# Patient Record
Sex: Male | Born: 2000 | Hispanic: Yes | Marital: Single | Smoking: Never smoker
Health system: Southern US, Community
[De-identification: ages and names within clinical notes are randomized; demographics above are authoritative.]

---
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
## 2021-01-01 LAB — CBC
HCT: 25.6 % — ABNORMAL LOW (ref 39.0–52.0)
Hemoglobin: 8.7 g/dL — ABNORMAL LOW (ref 13.0–17.0)
MCH: 29.1 pg (ref 26.0–34.0)
MCHC: 34 g/dL (ref 30.0–36.0)
MCV: 85.6 fL (ref 80.0–100.0)
Platelets: 360 10*3/uL (ref 150–400)
RBC: 2.99 MIL/uL — ABNORMAL LOW (ref 4.22–5.81)
RDW: 13.4 % (ref 11.5–15.5)
WBC: 10.4 10*3/uL (ref 4.0–10.5)
nRBC: 0 % (ref 0.0–0.2)

## 2021-01-01 LAB — PHOSPHORUS: Phosphorus: 4.3 mg/dL (ref 2.5–4.6)

## 2021-01-01 LAB — DIFFERENTIAL
Abs Immature Granulocytes: 0.3 10*3/uL — ABNORMAL HIGH (ref 0.00–0.07)
Basophils Absolute: 0 10*3/uL (ref 0.0–0.1)
Basophils Relative: 0 %
Eosinophils Absolute: 0.3 10*3/uL (ref 0.0–0.5)
Eosinophils Relative: 2 %
Immature Granulocytes: 3 %
Lymphocytes Relative: 15 %
Lymphs Abs: 1.6 10*3/uL (ref 0.7–4.0)
Monocytes Absolute: 0.9 10*3/uL (ref 0.1–1.0)
Monocytes Relative: 8 %
Neutro Abs: 7.4 10*3/uL (ref 1.7–7.7)
Neutrophils Relative %: 72 %

## 2021-01-01 LAB — COMPREHENSIVE METABOLIC PANEL
ALT: 42 U/L (ref 0–44)
AST: 29 U/L (ref 15–41)
Albumin: 2.5 g/dL — ABNORMAL LOW (ref 3.5–5.0)
Alkaline Phosphatase: 105 U/L (ref 38–126)
Anion gap: 9 (ref 5–15)
BUN: 12 mg/dL (ref 6–20)
CO2: 26 mmol/L (ref 22–32)
Calcium: 8.9 mg/dL (ref 8.9–10.3)
Chloride: 101 mmol/L (ref 98–111)
Creatinine, Ser: 0.75 mg/dL (ref 0.61–1.24)
GFR, Estimated: >60 mL/min (ref >60)
Glucose, Bld: 103 mg/dL — ABNORMAL HIGH (ref 70–99)
Potassium: 4.8 mmol/L (ref 3.5–5.1)
Sodium: 136 mmol/L (ref 135–145)
Total Bilirubin: 1.3 mg/dL — ABNORMAL HIGH (ref 0.3–1.2)
Total Protein: 6.1 g/dL — ABNORMAL LOW (ref 6.5–8.1)

## 2021-01-01 LAB — GLUCOSE, CAPILLARY
Glucose-Capillary: 90 mg/dL (ref 70–99)
Glucose-Capillary: 81 mg/dL (ref 70–99)

## 2021-01-01 LAB — TRIGLYCERIDES: Triglycerides: 122 mg/dL (ref <150)

## 2021-01-01 LAB — MAGNESIUM: Magnesium: 2.1 mg/dL (ref 1.7–2.4)

## 2021-01-01 LAB — PREALBUMIN: Prealbumin: 14.6 mg/dL — ABNORMAL LOW (ref 18–38)

## 2021-01-01 MED ORDER — SODIUM CHLORIDE 0.9% FLUSH
10.0000 mL | INTRAVENOUS | Status: DC | PRN
  Administered 2021-01-03: 10 mL

## 2021-01-01 MED ORDER — ACETAMINOPHEN 160 MG/5ML PO SOLN
1000.0000 mg | Freq: Four times a day (QID) | ORAL | Status: DC
  Administered 2021-01-01 – 2021-01-05 (×14): 1000 mg
  Filled 2021-01-01 (×16): qty 40.6

## 2021-01-01 MED ORDER — FAMOTIDINE IN NACL 20-0.9 MG/50ML-% IV SOLN
20.0000 mg | Freq: Two times a day (BID) | INTRAVENOUS | Status: DC
  Administered 2021-01-01 – 2021-01-07 (×12): 20 mg via INTRAVENOUS
  Filled 2021-01-01 (×16): qty 50

## 2021-01-01 MED ORDER — OXYCODONE HCL 5 MG/5ML PO SOLN
5.0000 mg | ORAL | Status: DC | PRN
  Administered 2021-01-01 – 2021-01-02 (×5): 10 mg via ORAL
  Filled 2021-01-01 (×5): qty 10

## 2021-01-01 MED ORDER — TRAVASOL 10 % IV SOLN
INTRAVENOUS | Status: AC
  Filled 2021-01-01: qty 528

## 2021-01-01 MED ORDER — CHLORHEXIDINE GLUCONATE CLOTH 2 % EX PADS
6.0000 | MEDICATED_PAD | Freq: Every day | CUTANEOUS | Status: DC
  Administered 2021-01-03 – 2021-01-10 (×7): 6 via TOPICAL

## 2021-01-01 MED ORDER — LACTATED RINGERS IV SOLN: INTRAVENOUS | Status: AC

## 2021-01-01 MED ORDER — INSULIN ASPART 100 UNIT/ML IJ SOLN
0.0000 [IU] | Freq: Four times a day (QID) | INTRAMUSCULAR | Status: DC
  Administered 2021-01-02 – 2021-01-06 (×12): 1 [IU] via SUBCUTANEOUS

## 2021-01-01 NOTE — Progress Notes (Signed)
Securechat with Dr Bobbye Morton, states ok to place a double lumen PICC instead of triple lumen.  Order modified.
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
## 2021-01-01 NOTE — Progress Notes (Addendum)
Peripherally Inserted Central Catheter Placement  The IV Nurse has discussed with the patient and/or persons authorized to consent for the patient, the purpose of this procedure and the potential benefits and risks involved with this procedure.  The benefits include less needle sticks, lab draws from the catheter, and the patient may be discharged home with the catheter. Risks include, but not limited to, infection, bleeding, blood clot (thrombus formation), and puncture of an artery; nerve damage and irregular heartbeat and possibility to perform a PICC exchange if needed/ordered by physician.  Alternatives to this procedure were also discussed.  Bard Power PICC patient education guide, fact sheet on infection prevention and patient information card has been provided to patient /or left at bedside.  Both parents at bedside, verbalize understanding and are agreeable to procedure.  PICC Placement Documentation  PICC Double Lumen [id number] PICC Right Basilic 36 cm 0 cm (Active)  Indication for Insertion or Continuance of Line Administration of hyperosmolar/irritating solutions (i.e. TPN, Vancomycin, etc.) 01/01/21 1026  Exposed Catheter (cm) 0 cm 01/01/21 1026  Site Assessment Dry;Clean;Intact 01/01/21 1026  Lumen #1 Status Flushed;Saline locked;Blood return noted 01/01/21 1026  Lumen #2 Status Flushed;Saline locked;Blood return noted 01/01/21 1026  Dressing Type Transparent 01/01/21 1026  Dressing Status Clean;Dry;Intact 01/01/21 1026  Antimicrobial disc in place? Yes 01/01/21 1026  Safety Lock Not Applicable [id number]  Line Care Connections checked and tightened 01/01/21 1026  Line Adjustment (NICU/IV Team Only) No 01/01/21 1026  Dressing Intervention New dressing 01/01/21 1026  Dressing Change Due 01/08/21 01/01/21 1026       Rolena Infante 01/01/2021, 10:27 AM
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
## 2021-01-02 LAB — CBC
HCT: 23.2 % — ABNORMAL LOW (ref 39.0–52.0)
Hemoglobin: 7.5 g/dL — ABNORMAL LOW (ref 13.0–17.0)
MCH: 28.4 pg (ref 26.0–34.0)
MCHC: 32.3 g/dL (ref 30.0–36.0)
MCV: 87.9 fL (ref 80.0–100.0)
Platelets: 393 10*3/uL (ref 150–400)
RBC: 2.64 MIL/uL — ABNORMAL LOW (ref 4.22–5.81)
RDW: 13.2 % (ref 11.5–15.5)
WBC: 11 10*3/uL — ABNORMAL HIGH (ref 4.0–10.5)
nRBC: 0 % (ref 0.0–0.2)

## 2021-01-02 LAB — BASIC METABOLIC PANEL
Anion gap: 6 (ref 5–15)
BUN: 13 mg/dL (ref 6–20)
CO2: 25 mmol/L (ref 22–32)
Calcium: 8.2 mg/dL — ABNORMAL LOW (ref 8.9–10.3)
Chloride: 105 mmol/L (ref 98–111)
Creatinine, Ser: 0.67 mg/dL (ref 0.61–1.24)
GFR, Estimated: >60 mL/min (ref >60)
Glucose, Bld: 118 mg/dL — ABNORMAL HIGH (ref 70–99)
Potassium: 4.2 mmol/L (ref 3.5–5.1)
Sodium: 136 mmol/L (ref 135–145)

## 2021-01-02 LAB — GLUCOSE, CAPILLARY
Glucose-Capillary: 111 mg/dL — ABNORMAL HIGH (ref 70–99)
Glucose-Capillary: 120 mg/dL — ABNORMAL HIGH (ref 70–99)
Glucose-Capillary: 126 mg/dL — ABNORMAL HIGH (ref 70–99)
Glucose-Capillary: 137 mg/dL — ABNORMAL HIGH (ref 70–99)
Glucose-Capillary: 97 mg/dL (ref 70–99)

## 2021-01-02 LAB — PHOSPHORUS: Phosphorus: 4.2 mg/dL (ref 2.5–4.6)

## 2021-01-02 LAB — MAGNESIUM: Magnesium: 2.1 mg/dL (ref 1.7–2.4)

## 2021-01-02 MED ORDER — TRAVASOL 10 % IV SOLN
INTRAVENOUS | Status: AC
  Filled 2021-01-02: qty 1188
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
## 2021-01-03 ENCOUNTER — Inpatient Hospital Stay (HOSPITAL_COMMUNITY): Payer: 59

## 2021-01-03 LAB — COMPREHENSIVE METABOLIC PANEL
ALT: 23 U/L (ref 0–44)
AST: 15 U/L (ref 15–41)
Albumin: 2.2 g/dL — ABNORMAL LOW (ref 3.5–5.0)
Alkaline Phosphatase: 91 U/L (ref 38–126)
Anion gap: 7 (ref 5–15)
BUN: 13 mg/dL (ref 6–20)
CO2: 23 mmol/L (ref 22–32)
Calcium: 8 mg/dL — ABNORMAL LOW (ref 8.9–10.3)
Chloride: 105 mmol/L (ref 98–111)
Creatinine, Ser: 0.58 mg/dL — ABNORMAL LOW (ref 0.61–1.24)
GFR, Estimated: >60 mL/min (ref >60)
Glucose, Bld: 123 mg/dL — ABNORMAL HIGH (ref 70–99)
Potassium: 3.7 mmol/L (ref 3.5–5.1)
Sodium: 135 mmol/L (ref 135–145)
Total Bilirubin: 0.4 mg/dL (ref 0.3–1.2)
Total Protein: 5.7 g/dL — ABNORMAL LOW (ref 6.5–8.1)

## 2021-01-03 LAB — DIFFERENTIAL
Abs Immature Granulocytes: 0.27 10*3/uL — ABNORMAL HIGH (ref 0.00–0.07)
Basophils Absolute: 0 10*3/uL (ref 0.0–0.1)
Basophils Relative: 0 %
Eosinophils Absolute: 0.3 10*3/uL (ref 0.0–0.5)
Eosinophils Relative: 3 %
Immature Granulocytes: 3 %
Lymphocytes Relative: 14 %
Lymphs Abs: 1.5 10*3/uL (ref 0.7–4.0)
Monocytes Absolute: 0.9 10*3/uL (ref 0.1–1.0)
Monocytes Relative: 9 %
Neutro Abs: 7.9 10*3/uL — ABNORMAL HIGH (ref 1.7–7.7)
Neutrophils Relative %: 71 %

## 2021-01-03 LAB — GLUCOSE, CAPILLARY
Glucose-Capillary: 121 mg/dL — ABNORMAL HIGH (ref 70–99)
Glucose-Capillary: 97 mg/dL (ref 70–99)
Glucose-Capillary: 134 mg/dL — ABNORMAL HIGH (ref 70–99)
Glucose-Capillary: 131 mg/dL — ABNORMAL HIGH (ref 70–99)

## 2021-01-03 LAB — CBC
HCT: 23.2 % — ABNORMAL LOW (ref 39.0–52.0)
Hemoglobin: 7.2 g/dL — ABNORMAL LOW (ref 13.0–17.0)
MCH: 27.9 pg (ref 26.0–34.0)
MCHC: 31 g/dL (ref 30.0–36.0)
MCV: 89.9 fL (ref 80.0–100.0)
Platelets: 446 10*3/uL — ABNORMAL HIGH (ref 150–400)
RBC: 2.58 MIL/uL — ABNORMAL LOW (ref 4.22–5.81)
RDW: 13.7 % (ref 11.5–15.5)
WBC: 10.9 10*3/uL — ABNORMAL HIGH (ref 4.0–10.5)
nRBC: 0 % (ref 0.0–0.2)

## 2021-01-03 LAB — HEMOGLOBIN A1C
Hgb A1c MFr Bld: 5 % (ref 4.8–5.6)
Mean Plasma Glucose: 97 mg/dL

## 2021-01-03 LAB — MAGNESIUM: Magnesium: 2 mg/dL (ref 1.7–2.4)

## 2021-01-03 LAB — TRIGLYCERIDES: Triglycerides: 115 mg/dL (ref <150)

## 2021-01-03 LAB — RESP PANEL BY RT-PCR (FLU A&B, COVID) ARPGX2
Influenza A by PCR: NEGATIVE
Influenza B by PCR: NEGATIVE
SARS Coronavirus 2 by RT PCR: NEGATIVE

## 2021-01-03 LAB — PHOSPHORUS: Phosphorus: 3.7 mg/dL (ref 2.5–4.6)

## 2021-01-03 LAB — PREALBUMIN: Prealbumin: 13.8 mg/dL — ABNORMAL LOW (ref 18–38)

## 2021-01-03 MED ORDER — LACTATED RINGERS IV BOLUS
1000.0000 mL | Freq: Once | INTRAVENOUS | Status: AC
  Administered 2021-01-03: 1000 mL via INTRAVENOUS

## 2021-01-03 MED ORDER — OXYCODONE HCL 5 MG/5ML PO SOLN
5.0000 mg | ORAL | Status: DC | PRN
  Administered 2021-01-03 – 2021-01-07 (×5): 10 mg
  Filled 2021-01-03 (×6): qty 10

## 2021-01-03 MED ORDER — TRAVASOL 10 % IV SOLN
INTRAVENOUS | Status: AC
  Filled 2021-01-03: qty 1188

## 2021-01-03 MED ORDER — PIPERACILLIN-TAZOBACTAM 3.375 G IVPB
3.3750 g | Freq: Three times a day (TID) | INTRAVENOUS | Status: DC
  Administered 2021-01-03 – 2021-01-10 (×21): 3.375 g via INTRAVENOUS
  Filled 2021-01-03 (×22): qty 50

## 2021-01-03 MED ORDER — IOHEXOL 300 MG/ML  SOLN
100.0000 mL | Freq: Once | INTRAMUSCULAR | Status: AC | PRN
  Administered 2021-01-03: 100 mL via INTRAVENOUS

## 2021-01-03 MED ORDER — IBUPROFEN 100 MG/5ML PO SUSP
600.0000 mg | Freq: Four times a day (QID) | ORAL | Status: DC
  Administered 2021-01-03 – 2021-01-05 (×4): 600 mg
  Filled 2021-01-03 (×14): qty 30

## 2021-01-03 NOTE — Progress Notes (Signed)
Pt still running a fever and remains tachycardiac despite ice packs being placed under armpits and groin.
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
## 2021-01-03 NOTE — Progress Notes (Signed)
   01/03/21 1034  Assess: MEWS Score  BP (!) 137/93  Pulse Rate (!) 147  SpO2 100 %  O2 Device Room Air  Assess: MEWS Score  MEWS Temp 2  MEWS Systolic 0  MEWS Pulse 3  MEWS RR 0  MEWS LOC 0  MEWS Score 5  MEWS Score Color Red  Assess: if the MEWS score is Yellow or Red  Were vital signs taken at a resting state? Yes  Focused Assessment Change from prior assessment (see assessment flowsheet)  Early Detection of Sepsis Score *See Row Information* Low  MEWS guidelines implemented *See Row Information* Yes  Treat  MEWS Interventions Administered scheduled meds/treatments  Take Vital Signs  Increase Vital Sign Frequency  Red: Q 1hr X 4 then Q 4hr X 4, if remains red, continue Q 4hrs  Escalate  MEWS: Escalate Red: discuss with charge nurse/RN and provider, consider discussing with RRT  Notify: Charge Nurse/RN  Name of Charge Nurse/RN Notified Racheal Patches, RN  Date Charge Nurse/RN Notified 01/03/21  Time Charge Nurse/RN Notified 1010  Notify: Provider  Provider Name/Title Barkley Boards, PA  Date Provider Notified 01/03/21  Time Provider Notified 1040  Notification Type Call (by trauma RN at bedside)  Notification Reason Change in status  Provider response At bedside;See new orders  Date of Provider Response 01/03/21  Time of Provider Response 1100  Notify: Rapid Response  Name of Rapid Response RN Notified Lollie Marrow, RN Santiago Glad, trauma RN also at bedside)  Date Rapid Response Notified 01/03/21  Time Rapid Response Notified 1013  Document  Progress note created (see row info) Yes
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
## 2021-01-03 NOTE — Progress Notes (Signed)
Pt's heart rate went up to the 170s when he got up to use the bathroom. Now down to the 140s. MD paged.  NGT also retracted by 15cm as ordered by PA. Abdominal xray ordered to confirm placement

## 2021-01-03 NOTE — Progress Notes (Signed)
MD updated on current pt condition. Heart rate in the 160s and sustaining, and is now vomiting. Prn zofran administered.

## 2021-01-03 NOTE — Progress Notes (Signed)
Santiago Glad, trauma RN, at bedside.

## 2021-01-03 NOTE — Progress Notes (Signed)
Minimal pale red drainage noted from incision site. Staples intact. Gauze placed. Will continue to monitor.

## 2021-01-03 NOTE — Progress Notes (Signed)
TRN note -  Called to bedside, pt spiked temp to 103.4- given tylenol. Dr. Bobbye Morton notified. Orders received, will transfer to 4NP once bed is available. Bed control notified also.   Pt is awake, alert/oriented. Appears like he does not feel well, holding onto abd. Small amount of sero-sanguinous drainage from incision line. Staples remain intact. NGT draining greenish liquid.   Spoke with mother and pt with use of interpretor machine-- mother voices understanding as does the pt.   Rolene Arbour, RN Trauma Response Nurse [phone] (please call with any questions)

## 2021-01-03 NOTE — Progress Notes (Signed)
Pt and mother updated of transfer to progressive care via [facility] interpreter. Awaiting bed. All questions answered to satisfaction. Will continue to monitor.
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
## 2021-01-04 LAB — CBC
HCT: 22.5 % — ABNORMAL LOW (ref 39.0–52.0)
HCT: 23.3 % — ABNORMAL LOW (ref 39.0–52.0)
Hemoglobin: 7 g/dL — ABNORMAL LOW (ref 13.0–17.0)
Hemoglobin: 7.1 g/dL — ABNORMAL LOW (ref 13.0–17.0)
MCH: 28.7 pg (ref 26.0–34.0)
MCH: 29.3 pg (ref 26.0–34.0)
MCHC: 31.1 g/dL (ref 30.0–36.0)
MCHC: 30.5 g/dL (ref 30.0–36.0)
MCV: 92.2 fL (ref 80.0–100.0)
MCV: 96.3 fL (ref 80.0–100.0)
Platelets: 423 10*3/uL — ABNORMAL HIGH (ref 150–400)
Platelets: 433 10*3/uL — ABNORMAL HIGH (ref 150–400)
RBC: 2.44 MIL/uL — ABNORMAL LOW (ref 4.22–5.81)
RBC: 2.42 MIL/uL — ABNORMAL LOW (ref 4.22–5.81)
RDW: 14.3 % (ref 11.5–15.5)
RDW: 14.8 % (ref 11.5–15.5)
WBC: 16.2 10*3/uL — ABNORMAL HIGH (ref 4.0–10.5)
WBC: 17.8 10*3/uL — ABNORMAL HIGH (ref 4.0–10.5)
nRBC: 0 % (ref 0.0–0.2)
nRBC: 0 % (ref 0.0–0.2)

## 2021-01-04 LAB — GLUCOSE, CAPILLARY
Glucose-Capillary: 128 mg/dL — ABNORMAL HIGH (ref 70–99)
Glucose-Capillary: 140 mg/dL — ABNORMAL HIGH (ref 70–99)
Glucose-Capillary: 129 mg/dL — ABNORMAL HIGH (ref 70–99)
Glucose-Capillary: 124 mg/dL — ABNORMAL HIGH (ref 70–99)

## 2021-01-04 MED ORDER — TRACE MINERALS CU-MN-SE-ZN 300-55-60-3000 MCG/ML IV SOLN
INTRAVENOUS | Status: AC
  Filled 2021-01-04: qty 1188

## 2021-01-04 MED ORDER — GUAIFENESIN ER 600 MG PO TB12
600.0000 mg | ORAL_TABLET | Freq: Two times a day (BID) | ORAL | Status: DC | PRN
  Administered 2021-01-04: 600 mg via ORAL
  Filled 2021-01-04: qty 1

## 2021-01-04 NOTE — Progress Notes (Signed)
Patient became febrile around 1800 and I gave him tylenol and paged doctor. He told me to give him the advil and it was to be expected.
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
## 2021-01-05 LAB — BASIC METABOLIC PANEL
Anion gap: 6 (ref 5–15)
BUN: 13 mg/dL (ref 6–20)
CO2: 23 mmol/L (ref 22–32)
Calcium: 8 mg/dL — ABNORMAL LOW (ref 8.9–10.3)
Chloride: 107 mmol/L (ref 98–111)
Creatinine, Ser: 0.55 mg/dL — ABNORMAL LOW (ref 0.61–1.24)
GFR, Estimated: >60 mL/min (ref >60)
Glucose, Bld: 130 mg/dL — ABNORMAL HIGH (ref 70–99)
Potassium: 3.9 mmol/L (ref 3.5–5.1)
Sodium: 136 mmol/L (ref 135–145)

## 2021-01-05 LAB — PHOSPHORUS: Phosphorus: 3.7 mg/dL (ref 2.5–4.6)

## 2021-01-05 LAB — MAGNESIUM: Magnesium: 2.1 mg/dL (ref 1.7–2.4)

## 2021-01-05 LAB — CBC
HCT: 20.3 % — ABNORMAL LOW (ref 39.0–52.0)
Hemoglobin: 6.2 g/dL — CL (ref 13.0–17.0)
MCH: 29.8 pg (ref 26.0–34.0)
MCHC: 30.5 g/dL (ref 30.0–36.0)
MCV: 97.6 fL (ref 80.0–100.0)
Platelets: 358 10*3/uL (ref 150–400)
RBC: 2.08 MIL/uL — ABNORMAL LOW (ref 4.22–5.81)
RDW: 15.1 % (ref 11.5–15.5)
WBC: 13.7 10*3/uL — ABNORMAL HIGH (ref 4.0–10.5)
nRBC: 0 % (ref 0.0–0.2)

## 2021-01-05 LAB — GLUCOSE, CAPILLARY
Glucose-Capillary: 116 mg/dL — ABNORMAL HIGH (ref 70–99)
Glucose-Capillary: 119 mg/dL — ABNORMAL HIGH (ref 70–99)
Glucose-Capillary: 122 mg/dL — ABNORMAL HIGH (ref 70–99)
Glucose-Capillary: 139 mg/dL — ABNORMAL HIGH (ref 70–99)
Glucose-Capillary: 141 mg/dL — ABNORMAL HIGH (ref 70–99)

## 2021-01-05 LAB — HEMOGLOBIN AND HEMATOCRIT, BLOOD
HCT: 23.5 % — ABNORMAL LOW (ref 39.0–52.0)
Hemoglobin: 7.6 g/dL — ABNORMAL LOW (ref 13.0–17.0)

## 2021-01-05 LAB — PREPARE RBC (CROSSMATCH)

## 2021-01-05 MED ORDER — SODIUM CHLORIDE 0.9% IV SOLUTION: Freq: Once | INTRAVENOUS | Status: AC

## 2021-01-05 MED ORDER — TRAVASOL 10 % IV SOLN
INTRAVENOUS | Status: AC
  Filled 2021-01-05: qty 1188
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
## 2021-01-05 NOTE — Plan of Care (Signed)
  Problem: Clinical Measurements: Goal: Ability to maintain clinical measurements within normal limits will improve Outcome: Progressing   Problem: Clinical Measurements: Goal: Will remain free from infection Outcome: Progressing   Problem: Clinical Measurements: Goal: Diagnostic test results will improve Outcome: Progressing   Problem: Clinical Measurements: Goal: Respiratory complications will improve Outcome: Progressing   Problem: Clinical Measurements: Goal: Cardiovascular complication will be avoided Outcome: Progressing   Problem: Nutrition: Goal: Adequate nutrition will be maintained Outcome: Progressing   Problem: Coping: Goal: Level of anxiety will decrease Outcome: Progressing   Problem: Elimination: Goal: Will not experience complications related to bowel motility Outcome: Progressing   Problem: Pain Managment: Goal: General experience of comfort will improve Outcome: Progressing   Problem: Skin Integrity: Goal: Risk for impaired skin integrity will decrease Outcome: Progressing   Problem: Safety: Goal: Ability to remain free from injury will improve Outcome: Progressing
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
## 2021-01-06 LAB — CBC
HCT: 23.9 % — ABNORMAL LOW (ref 39.0–52.0)
Hemoglobin: 7.6 g/dL — ABNORMAL LOW (ref 13.0–17.0)
MCH: 27.6 pg (ref 26.0–34.0)
MCHC: 31.8 g/dL (ref 30.0–36.0)
MCV: 86.9 fL (ref 80.0–100.0)
Platelets: 452 10*3/uL — ABNORMAL HIGH (ref 150–400)
RBC: 2.75 MIL/uL — ABNORMAL LOW (ref 4.22–5.81)
RDW: 14.5 % (ref 11.5–15.5)
WBC: 15.5 10*3/uL — ABNORMAL HIGH (ref 4.0–10.5)
nRBC: 0.1 % (ref 0.0–0.2)

## 2021-01-06 LAB — TYPE AND SCREEN
ABO/RH(D): B POS
Antibody Screen: NEGATIVE
Unit division: 0

## 2021-01-06 LAB — COMPREHENSIVE METABOLIC PANEL
ALT: 39 U/L (ref 0–44)
AST: 26 U/L (ref 15–41)
Albumin: 2 g/dL — ABNORMAL LOW (ref 3.5–5.0)
Alkaline Phosphatase: 154 U/L — ABNORMAL HIGH (ref 38–126)
Anion gap: 6 (ref 5–15)
BUN: 12 mg/dL (ref 6–20)
CO2: 23 mmol/L (ref 22–32)
Calcium: 7.9 mg/dL — ABNORMAL LOW (ref 8.9–10.3)
Chloride: 105 mmol/L (ref 98–111)
Creatinine, Ser: 0.52 mg/dL — ABNORMAL LOW (ref 0.61–1.24)
GFR, Estimated: >60 mL/min (ref >60)
Glucose, Bld: 98 mg/dL (ref 70–99)
Potassium: 3.9 mmol/L (ref 3.5–5.1)
Sodium: 134 mmol/L — ABNORMAL LOW (ref 135–145)
Total Bilirubin: 0.4 mg/dL (ref 0.3–1.2)
Total Protein: 5.5 g/dL — ABNORMAL LOW (ref 6.5–8.1)

## 2021-01-06 LAB — MAGNESIUM: Magnesium: 2 mg/dL (ref 1.7–2.4)

## 2021-01-06 LAB — BPAM RBC
Blood Product Expiration Date: 202208152359
ISSUE DATE / TIME: 202207271220
Unit Type and Rh: 7300

## 2021-01-06 LAB — PHOSPHORUS: Phosphorus: 3.3 mg/dL (ref 2.5–4.6)

## 2021-01-06 LAB — GLUCOSE, CAPILLARY
Glucose-Capillary: 133 mg/dL — ABNORMAL HIGH (ref 70–99)
Glucose-Capillary: 144 mg/dL — ABNORMAL HIGH (ref 70–99)
Glucose-Capillary: 131 mg/dL — ABNORMAL HIGH (ref 70–99)
Glucose-Capillary: 113 mg/dL — ABNORMAL HIGH (ref 70–99)
Glucose-Capillary: 123 mg/dL — ABNORMAL HIGH (ref 70–99)

## 2021-01-06 MED ORDER — TRAVASOL 10 % IV SOLN
INTRAVENOUS | Status: AC
  Filled 2021-01-06: qty 1188

## 2021-01-06 MED ORDER — KETOROLAC TROMETHAMINE 30 MG/ML IJ SOLN
30.0000 mg | Freq: Three times a day (TID) | INTRAMUSCULAR | Status: DC
  Administered 2021-01-06 – 2021-01-10 (×12): 30 mg via INTRAVENOUS
  Filled 2021-01-06 (×12): qty 1

## 2021-01-06 MED ORDER — ACETAMINOPHEN 10 MG/ML IV SOLN
1000.0000 mg | Freq: Four times a day (QID) | INTRAVENOUS | Status: AC
  Administered 2021-01-06 – 2021-01-07 (×4): 1000 mg via INTRAVENOUS
  Filled 2021-01-06 (×4): qty 100

## 2021-01-06 MED ORDER — KETOROLAC TROMETHAMINE 15 MG/ML IJ SOLN: 30.0000 mg | Freq: Three times a day (TID) | INTRAMUSCULAR | Status: DC
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
## 2021-01-07 LAB — BASIC METABOLIC PANEL
Anion gap: 8 (ref 5–15)
BUN: 12 mg/dL (ref 6–20)
CO2: 24 mmol/L (ref 22–32)
Calcium: 8.1 mg/dL — ABNORMAL LOW (ref 8.9–10.3)
Chloride: 106 mmol/L (ref 98–111)
Creatinine, Ser: 0.52 mg/dL — ABNORMAL LOW (ref 0.61–1.24)
GFR, Estimated: >60 mL/min (ref >60)
Glucose, Bld: 116 mg/dL — ABNORMAL HIGH (ref 70–99)
Potassium: 4.3 mmol/L (ref 3.5–5.1)
Sodium: 138 mmol/L (ref 135–145)

## 2021-01-07 LAB — CBC
HCT: 24.1 % — ABNORMAL LOW (ref 39.0–52.0)
Hemoglobin: 7.6 g/dL — ABNORMAL LOW (ref 13.0–17.0)
MCH: 27.7 pg (ref 26.0–34.0)
MCHC: 31.5 g/dL (ref 30.0–36.0)
MCV: 88 fL (ref 80.0–100.0)
Platelets: 550 10*3/uL — ABNORMAL HIGH (ref 150–400)
RBC: 2.74 MIL/uL — ABNORMAL LOW (ref 4.22–5.81)
RDW: 14.5 % (ref 11.5–15.5)
WBC: 12.6 10*3/uL — ABNORMAL HIGH (ref 4.0–10.5)
nRBC: 0 % (ref 0.0–0.2)

## 2021-01-07 LAB — PHOSPHORUS: Phosphorus: 3.6 mg/dL (ref 2.5–4.6)

## 2021-01-07 LAB — GLUCOSE, CAPILLARY
Glucose-Capillary: 112 mg/dL — ABNORMAL HIGH (ref 70–99)
Glucose-Capillary: 120 mg/dL — ABNORMAL HIGH (ref 70–99)
Glucose-Capillary: 124 mg/dL — ABNORMAL HIGH (ref 70–99)
Glucose-Capillary: 121 mg/dL — ABNORMAL HIGH (ref 70–99)
Glucose-Capillary: 111 mg/dL — ABNORMAL HIGH (ref 70–99)

## 2021-01-07 LAB — MAGNESIUM: Magnesium: 2 mg/dL (ref 1.7–2.4)

## 2021-01-07 MED ORDER — TRAVASOL 10 % IV SOLN
INTRAVENOUS | Status: AC
  Filled 2021-01-07: qty 1188
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
## 2021-01-07 NOTE — Plan of Care (Signed)
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
## 2021-01-08 LAB — CBC
HCT: 25.8 % — ABNORMAL LOW (ref 39.0–52.0)
Hemoglobin: 8.3 g/dL — ABNORMAL LOW (ref 13.0–17.0)
MCH: 28.4 pg (ref 26.0–34.0)
MCHC: 32.2 g/dL (ref 30.0–36.0)
MCV: 88.4 fL (ref 80.0–100.0)
Platelets: 569 10*3/uL — ABNORMAL HIGH (ref 150–400)
RBC: 2.92 MIL/uL — ABNORMAL LOW (ref 4.22–5.81)
RDW: 14.3 % (ref 11.5–15.5)
WBC: 12.1 10*3/uL — ABNORMAL HIGH (ref 4.0–10.5)
nRBC: 0 % (ref 0.0–0.2)

## 2021-01-08 LAB — BASIC METABOLIC PANEL
Anion gap: 9 (ref 5–15)
BUN: 10 mg/dL (ref 6–20)
CO2: 23 mmol/L (ref 22–32)
Calcium: 8.3 mg/dL — ABNORMAL LOW (ref 8.9–10.3)
Chloride: 104 mmol/L (ref 98–111)
Creatinine, Ser: 0.59 mg/dL — ABNORMAL LOW (ref 0.61–1.24)
GFR, Estimated: >60 mL/min (ref >60)
Glucose, Bld: 120 mg/dL — ABNORMAL HIGH (ref 70–99)
Potassium: 4.5 mmol/L (ref 3.5–5.1)
Sodium: 136 mmol/L (ref 135–145)

## 2021-01-08 LAB — GLUCOSE, CAPILLARY
Glucose-Capillary: 117 mg/dL — ABNORMAL HIGH (ref 70–99)
Glucose-Capillary: 113 mg/dL — ABNORMAL HIGH (ref 70–99)
Glucose-Capillary: 131 mg/dL — ABNORMAL HIGH (ref 70–99)
Glucose-Capillary: 104 mg/dL — ABNORMAL HIGH (ref 70–99)
Glucose-Capillary: 122 mg/dL — ABNORMAL HIGH (ref 70–99)

## 2021-01-08 LAB — MAGNESIUM: Magnesium: 2.2 mg/dL (ref 1.7–2.4)

## 2021-01-08 LAB — CULTURE, BLOOD (ROUTINE X 2)
Culture: NO GROWTH
Culture: NO GROWTH
Special Requests: ADEQUATE
Special Requests: ADEQUATE

## 2021-01-08 LAB — PHOSPHORUS: Phosphorus: 4.4 mg/dL (ref 2.5–4.6)

## 2021-01-08 MED ORDER — OXYCODONE HCL 5 MG PO TABS
5.0000 mg | ORAL_TABLET | ORAL | Status: DC | PRN
  Administered 2021-01-08 – 2021-01-10 (×8): 10 mg via ORAL
  Filled 2021-01-08 (×8): qty 2

## 2021-01-08 MED ORDER — FAMOTIDINE 20 MG PO TABS
20.0000 mg | ORAL_TABLET | Freq: Two times a day (BID) | ORAL | Status: DC
  Administered 2021-01-08 – 2021-01-10 (×5): 20 mg via ORAL
  Filled 2021-01-08 (×5): qty 1

## 2021-01-08 MED ORDER — METHOCARBAMOL 750 MG PO TABS
750.0000 mg | ORAL_TABLET | Freq: Three times a day (TID) | ORAL | Status: DC
  Administered 2021-01-08 – 2021-01-10 (×7): 750 mg via ORAL
  Filled 2021-01-08 (×7): qty 1

## 2021-01-08 MED ORDER — TRAVASOL 10 % IV SOLN
INTRAVENOUS | Status: AC
  Filled 2021-01-08: qty 1188
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
## 2021-01-09 LAB — CBC
HCT: 27.7 % — ABNORMAL LOW (ref 39.0–52.0)
Hemoglobin: 8.8 g/dL — ABNORMAL LOW (ref 13.0–17.0)
MCH: 28 pg (ref 26.0–34.0)
MCHC: 31.8 g/dL (ref 30.0–36.0)
MCV: 88.2 fL (ref 80.0–100.0)
Platelets: 703 10*3/uL — ABNORMAL HIGH (ref 150–400)
RBC: 3.14 MIL/uL — ABNORMAL LOW (ref 4.22–5.81)
RDW: 14.4 % (ref 11.5–15.5)
WBC: 10.2 10*3/uL (ref 4.0–10.5)
nRBC: 0 % (ref 0.0–0.2)

## 2021-01-09 LAB — BASIC METABOLIC PANEL
Anion gap: 8 (ref 5–15)
BUN: 14 mg/dL (ref 6–20)
CO2: 25 mmol/L (ref 22–32)
Calcium: 8.6 mg/dL — ABNORMAL LOW (ref 8.9–10.3)
Chloride: 104 mmol/L (ref 98–111)
Creatinine, Ser: 0.68 mg/dL (ref 0.61–1.24)
GFR, Estimated: >60 mL/min (ref >60)
Glucose, Bld: 113 mg/dL — ABNORMAL HIGH (ref 70–99)
Potassium: 4.3 mmol/L (ref 3.5–5.1)
Sodium: 137 mmol/L (ref 135–145)

## 2021-01-09 MED ORDER — TRAMADOL HCL 50 MG PO TABS
50.0000 mg | ORAL_TABLET | Freq: Four times a day (QID) | ORAL | Status: DC
  Administered 2021-01-09 – 2021-01-10 (×5): 50 mg via ORAL
  Filled 2021-01-09 (×6): qty 1
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
## 2021-01-10 ENCOUNTER — Inpatient Hospital Stay (HOSPITAL_COMMUNITY): Payer: 59

## 2021-01-10 LAB — CBC
HCT: 25.6 % — ABNORMAL LOW (ref 39.0–52.0)
Hemoglobin: 8.1 g/dL — ABNORMAL LOW (ref 13.0–17.0)
MCH: 28.2 pg (ref 26.0–34.0)
MCHC: 31.6 g/dL (ref 30.0–36.0)
MCV: 89.2 fL (ref 80.0–100.0)
Platelets: 672 10*3/uL — ABNORMAL HIGH (ref 150–400)
RBC: 2.87 MIL/uL — ABNORMAL LOW (ref 4.22–5.81)
RDW: 14.2 % (ref 11.5–15.5)
WBC: 11.6 10*3/uL — ABNORMAL HIGH (ref 4.0–10.5)
nRBC: 0 % (ref 0.0–0.2)

## 2021-01-10 LAB — AEROBIC CULTURE W GRAM STAIN (SUPERFICIAL SPECIMEN)

## 2021-01-10 MED ORDER — IOHEXOL 300 MG/ML  SOLN
100.0000 mL | Freq: Once | INTRAMUSCULAR | Status: AC | PRN
  Administered 2021-01-10: 100 mL via INTRAVENOUS

## 2021-01-10 MED ORDER — ACETAMINOPHEN 500 MG PO TABS: 500.0000 mg | ORAL_TABLET | Freq: Three times a day (TID) | ORAL | 0 refills | Status: AC | PRN

## 2021-01-10 MED ORDER — TRAMADOL HCL 50 MG PO TABS: 50.0000 mg | ORAL_TABLET | Freq: Four times a day (QID) | ORAL | 0 refills | Status: DC | PRN

## 2021-01-10 MED ORDER — METHOCARBAMOL 750 MG PO TABS: 750.0000 mg | ORAL_TABLET | Freq: Three times a day (TID) | ORAL | 0 refills | Status: DC | PRN

## 2021-01-10 MED ORDER — AMOXICILLIN-POT CLAVULANATE 875-125 MG PO TABS: 1.0000 | ORAL_TABLET | Freq: Two times a day (BID) | ORAL | 0 refills | Status: DC

## 2021-01-10 MED ORDER — ONDANSETRON 4 MG PO TBDP
4.0000 mg | ORAL_TABLET | Freq: Four times a day (QID) | ORAL | 0 refills | Status: DC | PRN
Start: 2021-01-10 — End: 2021-11-24

## 2021-01-10 MED ORDER — OXYCODONE HCL 5 MG PO TABS: 5.0000 mg | ORAL_TABLET | Freq: Four times a day (QID) | ORAL | 0 refills | Status: DC | PRN

## 2021-01-10 NOTE — Discharge Instructions (Addendum)
Wet to Dry WOUND CARE: - Change dressing twice daily - Supplies: sterile saline, kerlex, scissors, ABD pads, tape  Remove dressing and all packing carefully, moistening with sterile saline as needed to avoid packing/internal dressing sticking to the wound. 2.   Clean edges of skin around the wound with water/gauze, making sure there is no tape debris or leakage left on skin that could cause skin irritation or breakdown. 3.   Dampen and clean kerlex with sterile saline and pack wound from wound base to skin level, making sure to take note of any possible areas of wound tracking, tunneling and packing appropriately. Wound can be packed loosely. Trim kerlex to size if a whole kerlex is not required. 4.   Cover wound with a dry ABD pad and secure with tape.  5.   Write the date/time on the dry dressing/tape to better track when the last dressing change occurred. 6.   Reapply abdominal binder - change dressing as needed if leakage occurs, wound gets contaminated, or patient requests to shower. - You may shower daily with wound open and following the shower the wound should be dried and a clean dressing placed.  - Medical grade tape as well as packing supplies can be found at [employer] on [address] or [employer] on [address]. The remaining supplies can be found at your local drug store, [employer] etc. - Please call the office if you have any redness, drainage or changes to the wound. If you have any concerns, please do not hesitate to call the office.      [facility], [place] [phone]  OPEN ABDOMINAL SURGERY: POST OP INSTRUCTIONS  Always review your discharge instruction sheet given to you by the facility where your surgery was performed.  IF YOU HAVE DISABILITY OR FAMILY LEAVE FORMS, YOU MUST BRING THEM TO THE OFFICE FOR PROCESSING.  PLEASE DO NOT GIVE THEM TO YOUR DOCTOR.  A prescription for pain medication may be given to you upon  discharge.  Take your pain medication as prescribed, if needed.  If narcotic pain medicine is not needed, then you may take acetaminophen (Tylenol) or ibuprofen (Advil) as needed. Take your usually prescribed medications unless otherwise directed. If you need a refill on your pain medication, please contact your pharmacy. They will contact our office to request authorization.  Prescriptions will not be filled after 5pm or on week-ends. You should follow a light diet the first few days after arrival home, such as soup and crackers, pudding, etc.unless your doctor has advised otherwise. A high-fiber, low fat diet can be resumed as tolerated.   Be sure to include lots of fluids daily. Most patients will experience some swelling and bruising on the chest and neck area.  Ice packs will help.  Swelling and bruising can take several days to resolve Most patients will experience some swelling and bruising in the area of the incision. Ice pack will help. Swelling and bruising can take several days to resolve..  It is common to experience some constipation if taking pain medication after surgery.  Increasing fluid intake and taking a stool softener will usually help or prevent this problem from occurring.  A mild laxative (Milk of Magnesia or Miralax) should be taken according to package directions if there are no bowel movements after 48 hours.  You may have steri-strips (small skin tapes) in place directly over the incision.  These strips should be left on the skin for  7-10 days.  If your surgeon used skin glue on the incision, you may shower in 24 hours.  The glue will flake off over the next 2-3 weeks.  Any sutures or staples will be removed at the office during your follow-up visit. You may find that a light gauze bandage over your incision may keep your staples from being rubbed or pulled. You may shower and replace the bandage daily. ACTIVITIES:  You may resume regular (light) daily activities beginning the next  day--such as daily self-care, walking, climbing stairs--gradually increasing activities as tolerated.  You may have sexual intercourse when it is comfortable.  Refrain from any heavy lifting or straining until approved by your doctor. You may drive when you no longer are taking prescription pain medication, you can comfortably wear a seatbelt, and you can safely maneuver your car and apply brakes Return to Work: ___________________________________ Dennis Bast should see your doctor in the office for a follow-up appointment approximately two weeks after your surgery.  Make sure that you call for this appointment within a day or two after you arrive home to insure a convenient appointment time. OTHER INSTRUCTIONS:  _____________________________________________________________ _____________________________________________________________  WHEN TO CALL YOUR DOCTOR: Fever over 101.0 Inability to urinate Nausea and/or vomiting Extreme swelling or bruising Continued bleeding from incision. Increased pain, redness, or drainage from the incision. Difficulty swallowing or breathing Muscle cramping or spasms. Numbness or tingling in hands or feet or around lips.  The clinic staff is available to answer your questions during regular business hours.  Please don't hesitate to call and ask to speak to one of the nurses if you have concerns.  For further questions, please visit [email or web address]   Remove tape, gauze wrap bandages, or ties securing the existing secondary dressing. Using the nondominant hand, gently press down on the intact skin just outside the dressing edges to provide counterpressure and then pull the adhesive edges parallel to the skin toward the dressing.  If the dressing is covering areas of the body that have hair, remove the dressing in the direction of hair growth. If a dry dressing adheres to the wound, moisten the dressing with a 0.9% sodium chloride solution and remove  it. Perform hand hygiene and put on clean gloves. Apply the dressing. Moist-to-dry dressing  Pour the prescribed sterile solution (i.e., 0.9% sodium chloride solution) on the gauze or gauze wrap or cut the packing strip to be used to fill the wound bed. Wring out excess solution.  Apply moistened gauze or packing material as a single layer directly onto the wound surface.  If the wound is deep, loosely fill it with additional gauze or packing material using sterile forceps until all wound surfaces are in contact with moist gauze. Ensure that moist gauze does not overlap onto the outside skin. Do not pack the wound too tightly because tight packing may cause wound trauma. Apply dry sterile 4  4-inch gauze over the moist gauze. Cover with an ABD pad (or similar product) or additional layers of gauze. Secure the dressing.  Apply tape to the dressing edges in a window-pane fashion, ensuring sufficient contact with both the intact skin and dressing. Use nonallergenic tape as needed.

## 2021-01-10 NOTE — TOC Transition Note (Signed)
Transition of Care [facility]) - CM/SW Discharge Note   Patient Details  Name: Allen [name] MRN: [id number] Date of Birth: 2000-12-02  Transition of Care [facility]) CM/SW Contact:  Ella Bodo, RN Phone Number: 01/10/2021, 1:27 PM   Clinical Narrative:   Patient medically stable for discharge home with mother later this evening.  Patient needs home health follow-up for wound care and physical therapy; referral to [facility], per insurance contracts/patient choice.  Bedside nurse to provide teaching to patient and mom on wet-to-dry dressing prior to discharge today.  No other discharge needs identified.    Final next level of care: [place] Barriers to Discharge: Barriers Resolved   Patient Goals and CMS Choice Patient states their goals for this hospitalization and ongoing recovery are:: to go home CMS Medicare.gov Compare Post Acute Care list provided to:: Patient Choice offered to / list presented to : Patient                        Discharge Plan and Services   Discharge Planning Services: CM Consult Post Acute Care Choice: Home Health                    HH Arranged: RN, PT [facility] Agency: [place] Date [place]: 01/10/21 Time [place]: 1326 Representative spoke with at [place]: Adela Lank  Social Determinants of Health (SDOH) Interventions     Readmission Risk Interventions No flowsheet data found.  Reinaldo Raddle, RN, BSN  Trauma/Neuro ICU Case Manager [phone]
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
## 2021-01-10 NOTE — Progress Notes (Signed)
Instructed patient on procedure of line removal. HOB less than 45*. Pt held breath with line removal, pressure applied. No s/sx of bleeding. Applied pressure drsg and instructed patient to remain in bed for 30 min, keep drsg CDI for 24*, and do not lift more than 1 gl wt. Instructed pt to notify nurse if bleeding noted and to apply pressure as needed for bleeding to stop. Notified nurse line was pulled. Both VU. Fran Lowes, RN VAST
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
## 2021-01-10 NOTE — Progress Notes (Signed)
Pt with discharge orders. Discharge paperwork review with pt and mother with [place] translator. All questions answered. PICC line removed prior to d/c. Mother provided information on ABD dressing changes. Mother was given step-by-step instructions on dressing change for pt with mother performing dressing change. Pt provided dressing change supplies. Pt escorted out via wheelchair with all belongings to private vehicle.

## 2021-01-11 ENCOUNTER — Other Ambulatory Visit: Payer: Self-pay | Admitting: General Surgery

## 2021-01-11 DIAGNOSIS — R109 Unspecified abdominal pain: Secondary | ICD-10-CM

## 2021-01-13 ENCOUNTER — Encounter: Payer: Self-pay | Admitting: Physical Medicine & Rehabilitation

## 2021-01-25 ENCOUNTER — Other Ambulatory Visit: Payer: Self-pay

## 2021-01-25 ENCOUNTER — Ambulatory Visit
Admission: RE | Admit: 2021-01-25 | Discharge: 2021-01-25 | Disposition: A | Discharge status: Home / Self Care | Payer: 59 | Source: Ambulatory Visit | Attending: General Surgery | Admitting: General Surgery

## 2021-01-25 DIAGNOSIS — R109 Unspecified abdominal pain: Secondary | ICD-10-CM | POA: Insufficient documentation

## 2021-01-25 MED ORDER — IOHEXOL 350 MG/ML SOLN
75.0000 mL | Freq: Once | INTRAVENOUS | Status: AC | PRN
  Administered 2021-01-25: 75 mL via INTRAVENOUS

## 2021-02-08 ENCOUNTER — Other Ambulatory Visit: Payer: Self-pay | Admitting: Physician Assistant

## 2021-02-08 ENCOUNTER — Other Ambulatory Visit (HOSPITAL_COMMUNITY): Payer: Self-pay | Admitting: Physician Assistant

## 2021-02-08 DIAGNOSIS — T8143XA Infection following a procedure, organ and space surgical site, initial encounter: Secondary | ICD-10-CM

## 2021-02-10 ENCOUNTER — Other Ambulatory Visit: Payer: Self-pay

## 2021-02-10 ENCOUNTER — Encounter: Payer: 59 | Attending: Physical Medicine & Rehabilitation | Admitting: Physical Medicine & Rehabilitation

## 2021-02-10 ENCOUNTER — Encounter: Payer: Self-pay | Admitting: Physical Medicine & Rehabilitation

## 2021-02-10 VITALS — BP 115/82 | HR 111 | Temp 98.3°F | Ht 65.0 in | Wt 123.8 lb

## 2021-02-10 DIAGNOSIS — R1084 Generalized abdominal pain: Secondary | ICD-10-CM

## 2021-02-10 DIAGNOSIS — R110 Nausea: Secondary | ICD-10-CM

## 2021-02-10 DIAGNOSIS — S060X0S Concussion without loss of consciousness, sequela: Secondary | ICD-10-CM | POA: Diagnosis not present

## 2021-02-10 DIAGNOSIS — S060X0A Concussion without loss of consciousness, initial encounter: Secondary | ICD-10-CM | POA: Insufficient documentation
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
## 2021-02-24 ENCOUNTER — Other Ambulatory Visit: Payer: Self-pay

## 2021-02-24 ENCOUNTER — Ambulatory Visit
Admission: RE | Admit: 2021-02-24 | Discharge: 2021-02-24 | Disposition: A | Discharge status: Home / Self Care | Payer: 59 | Source: Ambulatory Visit | Attending: Physician Assistant | Admitting: Physician Assistant

## 2021-02-24 DIAGNOSIS — T8143XA Infection following a procedure, organ and space surgical site, initial encounter: Secondary | ICD-10-CM | POA: Insufficient documentation

## 2021-02-24 MED ORDER — IOHEXOL 300 MG/ML  SOLN
85.0000 mL | Freq: Once | INTRAMUSCULAR | Status: AC | PRN
  Administered 2021-02-24: 85 mL via INTRAVENOUS

## 2021-02-28 ENCOUNTER — Telehealth: Payer: Self-pay | Admitting: General Surgery
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
## 2021-09-29 ENCOUNTER — Other Ambulatory Visit: Payer: Self-pay | Admitting: Surgery

## 2021-09-29 DIAGNOSIS — K439 Ventral hernia without obstruction or gangrene: Secondary | ICD-10-CM

## 2021-10-06 ENCOUNTER — Ambulatory Visit
Admission: RE | Admit: 2021-10-06 | Discharge: 2021-10-06 | Disposition: A | Discharge status: Home / Self Care | Payer: 59 | Source: Ambulatory Visit | Attending: Surgery | Admitting: Surgery

## 2021-10-06 ENCOUNTER — Other Ambulatory Visit: Payer: Self-pay

## 2021-10-06 DIAGNOSIS — K439 Ventral hernia without obstruction or gangrene: Secondary | ICD-10-CM | POA: Diagnosis present

## 2021-10-06 MED ORDER — IOHEXOL 300 MG/ML  SOLN
100.0000 mL | Freq: Once | INTRAMUSCULAR | Status: AC | PRN
  Administered 2021-10-06: 100 mL via INTRAVENOUS

## 2021-11-13 ENCOUNTER — Encounter: Payer: Self-pay | Admitting: Emergency Medicine

## 2021-11-13 ENCOUNTER — Other Ambulatory Visit: Payer: Self-pay

## 2021-11-13 ENCOUNTER — Emergency Department
Admission: EM | Admit: 2021-11-13 | Discharge: 2021-11-13 | Disposition: A | Discharge status: Home / Self Care | Payer: 59 | Attending: Emergency Medicine | Admitting: Emergency Medicine

## 2021-11-13 ENCOUNTER — Emergency Department: Payer: 59

## 2021-11-13 DIAGNOSIS — R109 Unspecified abdominal pain: Secondary | ICD-10-CM | POA: Diagnosis present

## 2021-11-13 DIAGNOSIS — K439 Ventral hernia without obstruction or gangrene: Secondary | ICD-10-CM | POA: Diagnosis not present

## 2021-11-13 LAB — COMPREHENSIVE METABOLIC PANEL
ALT: 21 U/L (ref 0–44)
AST: 23 U/L (ref 15–41)
Albumin: 4.6 g/dL (ref 3.5–5.0)
Alkaline Phosphatase: 81 U/L (ref 38–126)
Anion gap: 7 (ref 5–15)
BUN: 10 mg/dL (ref 6–20)
CO2: 25 mmol/L (ref 22–32)
Calcium: 9.4 mg/dL (ref 8.9–10.3)
Chloride: 107 mmol/L (ref 98–111)
Creatinine, Ser: 0.74 mg/dL (ref 0.61–1.24)
GFR, Estimated: >60 mL/min (ref >60)
Glucose, Bld: 105 mg/dL — ABNORMAL HIGH (ref 70–99)
Potassium: 3.6 mmol/L (ref 3.5–5.1)
Sodium: 139 mmol/L (ref 135–145)
Total Bilirubin: 0.7 mg/dL (ref 0.3–1.2)
Total Protein: 7.8 g/dL (ref 6.5–8.1)

## 2021-11-13 LAB — CBC
HCT: 47.1 % (ref 39.0–52.0)
Hemoglobin: 14.9 g/dL (ref 13.0–17.0)
MCH: 25.5 pg — ABNORMAL LOW (ref 26.0–34.0)
MCHC: 31.6 g/dL (ref 30.0–36.0)
MCV: 80.5 fL (ref 80.0–100.0)
Platelets: 228 10*3/uL (ref 150–400)
RBC: 5.85 MIL/uL — ABNORMAL HIGH (ref 4.22–5.81)
RDW: 14.6 % (ref 11.5–15.5)
WBC: 6.6 10*3/uL (ref 4.0–10.5)
nRBC: 0 % (ref 0.0–0.2)

## 2021-11-13 LAB — LIPASE, BLOOD: Lipase: 56 U/L — ABNORMAL HIGH (ref 11–51)

## 2021-11-13 MED ORDER — IOHEXOL 300 MG/ML  SOLN
100.0000 mL | Freq: Once | INTRAMUSCULAR | Status: AC | PRN
  Administered 2021-11-13: 80 mL via INTRAVENOUS

## 2021-11-13 NOTE — ED Triage Notes (Signed)
Pt reports had hernia surgery a little over a year ago and for over a month he has had a dull ache over the incision area. Pt states the pain has gotten worse over the last couple of days and is very uncomfortable. Pt reports his initial surgery was complicated and he had to be reopened and left to heal from the inside out. Pt states at this time they told him he was at risk for developing an incisional hernia and he feels like that is what is happening. Pt states the pain is right over the same area.

## 2021-11-13 NOTE — ED Notes (Signed)
Pt's urine collected and sent off.
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
## 2021-11-13 NOTE — Discharge Instructions (Signed)
Follow-up with surgery.  Please call for an appointment.  Take Tylenol or ibuprofen for pain as needed.  Wear a back support that would help compress the area if you are lifting as the bowel will tend to protrude through the ventral hernia with lifting heavy items.

## 2021-11-24 ENCOUNTER — Ambulatory Visit: Payer: 59 | Admitting: Surgery

## 2021-11-24 ENCOUNTER — Encounter: Payer: Self-pay | Admitting: Surgery

## 2021-11-24 VITALS — BP 126/86 | HR 94 | Temp 98.2°F | Ht 65.0 in | Wt 148.4 lb

## 2021-11-24 DIAGNOSIS — K439 Ventral hernia without obstruction or gangrene: Secondary | ICD-10-CM | POA: Diagnosis not present

## 2021-11-24 NOTE — Patient Instructions (Signed)
Our surgery scheduler Barbara will call you within 24-48 hours to get you scheduled. If you have not heard from her after 48 hours, please call our office. Have the blue sheet available when she calls to write down important information.   If you have any concerns or questions, please feel free to call our office.   Laparoscopic Ventral Hernia Repair Laparoscopic ventral hernia repairis a procedure to fix a bulge of tissue that pushes through a weak area of muscle in the abdomen (ventral hernia). A ventral hernia may be: Above the belly button. This is called an epigastric hernia. At the belly button. This is called an umbilical hernia. At the incision site from previous abdominal surgery. This is called an incisional hernia. You may have this procedure as emergency surgery if part of your intestine gets trapped inside the hernia and starts to lose its blood supply (strangulation). Laparoscopic surgery is done through small incisions using a thin surgical telescope with a light and camera (laparoscope). During surgery, your surgeon will use images from the laparoscope to guide the procedure. A mesh screen will be placed in the hernia to close the opening and strengthen the abdominal wall. Tell a health care provider about: Any allergies you have. All medicines you are taking, including vitamins, herbs, eye drops, creams, and over-the-counter medicines. Any problems you or family members have had with anesthetic medicines. Any blood disorders you have. Any surgeries you have had. Any medical conditions you have. Whether you are pregnant or may be pregnant. What are the risks? Generally, this is a safe procedure. However, problems may occur, including: Infection. Bleeding. Damage to nearby structures or organs in the abdomen. Trouble urinating or having a bowel movement after surgery. Blood clots. The hernia coming back after surgery. Fluid buildup in the area of the hernia. In some cases,  your health care provider may need to switch from a laparoscopic procedure to a procedure that is done through a single, larger incision in the abdomen (open procedure). You may need an open procedure if: You have a hernia that is difficult to repair. Your organs are hard to see with the laparoscope. You have bleeding problems during the laparoscopic procedure. What happens before the procedure? Staying hydrated Follow instructions from your health care provider about hydration, which may include: Up to 2 hours before the procedure - you may continue to drink clear liquids, such as water, clear fruit juice, black coffee, and plain tea.  Eating and drinking restrictions Follow instructions from your health care provider about eating and drinking, which may include: 8 hours before the procedure - stop eating heavy meals or foods, such as meat, fried foods, or fatty foods. 6 hours before the procedure - stop eating light meals or foods, such as toast or cereal. 6 hours before the procedure - stop drinking milk or drinks that contain milk. 2 hours before the procedure - stop drinking clear liquids. Medicines Ask your health care provider about: Changing or stopping your regular medicines. This is especially important if you are taking diabetes medicines or blood thinners. Taking medicines such as aspirin and ibuprofen. These medicines can thin your blood. Do not take these medicines unless your health care provider tells you to take them. Taking over-the-counter medicines, vitamins, herbs, and supplements. Tests You may need to have tests before the procedure, such as: Blood tests. Urine tests. Abdominal ultrasound. Chest X-ray. Electrocardiogram (ECG). General instructions You may be asked to take a laxative or do an enema to   empty your bowel before surgery (bowel prep). Do not use any products that contain nicotine or tobacco for at least 4 weeks before the procedure. These products  include cigarettes, chewing tobacco, and vaping devices, such as e-cigarettes. If you need help quitting, ask your health care provider. Ask your health care provider: How your surgery site will be marked. What steps will be taken to help prevent infection. These steps may include: Removing hair at the surgery site. Washing skin with a germ-killing soap. Receiving antibiotic medicine. Plan to have a responsible adult take you home from the hospital or clinic. If you will be going home right after the procedure, plan to have a responsible adult care for you for the time you are told. This is important. What happens during the procedure?  An IV will be inserted into one of your veins. You will be given one or more of the following: A medicine to help you relax (sedative). A medicine to numb the area (local anesthetic). A medicine to make you fall asleep (general anesthetic). A small incision will be made in your abdomen. A hollow metal tube (trocar) will be placed through the incision. A tube will be placed through the trocar to inflate your abdomen with carbon dioxide. This makes it easier for your surgeon to see inside your abdomen during the repair. A laparoscope will be inserted into your abdomen through the trocar. The laparoscope will send images to a monitor in the operating room. Other trocars will be put through other small incisions in your abdomen. The surgical instruments needed for the procedure will be placed through these trocars. The tissue or intestines that make up the hernia will be moved back into place. The edges of the hernia may be stitched (sutured) together. A piece of mesh will be used to close the hernia. Sutures, clips, or staples will be used to keep the mesh in place. A bandage (dressing) or skin glue will be put over the incisions. The procedure may vary among health care providers and hospitals. What happens after the procedure? Your blood pressure, heart rate,  breathing rate, and blood oxygen level will be monitored until you leave the hospital or clinic. You will continue to receive fluids and medicines through an IV. Your IV will be removed when you can drink clear fluids. You will be given pain medicine as needed. You will be encouraged to get up and walk around as soon as possible. You will be shown how to do deep breathing exercises to help prevent a lung infection. If you were given a sedative during the procedure, it can affect you for several hours. Do not drive or operate machinery until your health care provider says that it is safe. Summary Laparoscopic ventral hernia is an operation to fix a hernia using small incisions. Tell your health care provider about other medical conditions that you have and about all the medicines that you are taking. Follow instructions from your health care provider about eating and drinking before the procedure. Plan to have a responsible adult take you home from the hospital or clinic. After the procedure, you will be encouraged to walk as soon as possible. You will also be taught how to do deep breathing exercises. This information is not intended to replace advice given to you by your health care provider. Make sure you discuss any questions you have with your health care provider. Document Revised: 01/16/2020 Document Reviewed: 01/16/2020 [employer] Patient Education  2023 [employer]  
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
## 2021-11-25 DIAGNOSIS — K439 Ventral hernia without obstruction or gangrene: Secondary | ICD-10-CM | POA: Insufficient documentation

## 2021-11-28 ENCOUNTER — Telehealth: Payer: Self-pay

## 2021-11-28 NOTE — Telephone Encounter (Addendum)
Faxed referral to [facility] at [phone].

## 2022-06-22 IMAGING — CT CT ABD-PELV W/ CM
2 of 4 series · 16 of 46 positions shown, 18 images · IV contrast (agent unspecified)
Comparison: CT abdomen and pelvis 02/24/2021

CLINICAL DATA: Ventral hernia, pain

EXAM:
CT ABDOMEN AND PELVIS WITH CONTRAST
TECHNIQUE: Multidetector CT imaging of the abdomen and pelvis was performed
using the standard protocol following bolus administration of
intravenous contrast.

[Series 2: abd pelvis 5.00 · axial · 0.79mm/px · z∈[-1508,-1043]mm · 13 of 103 slices shown, 15 images]
[im 5/103  soft-tissue]
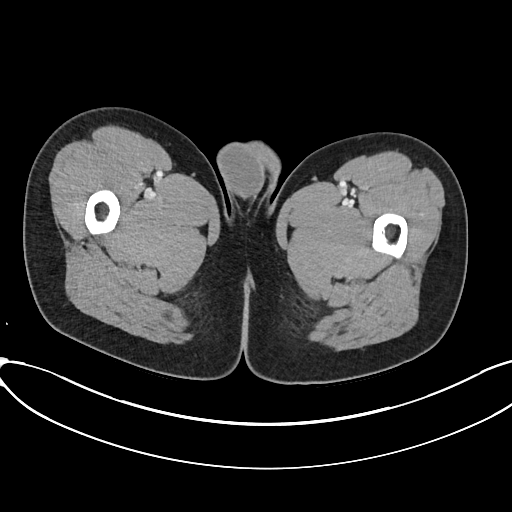
[im 5/103  bone]
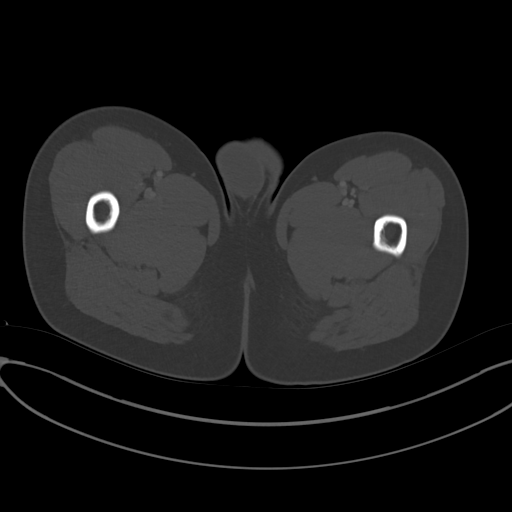
[im 14/103  soft-tissue]
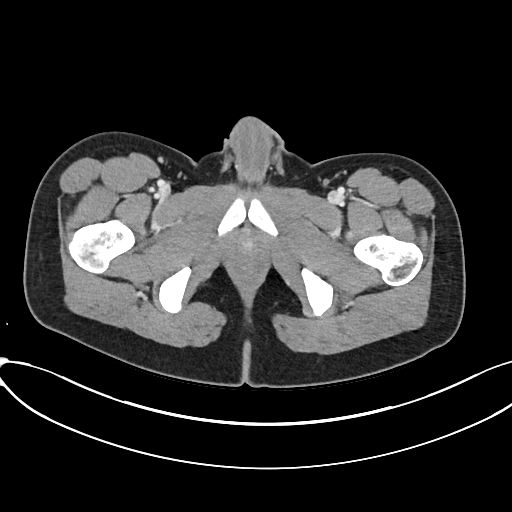
[im 24/103  soft-tissue]
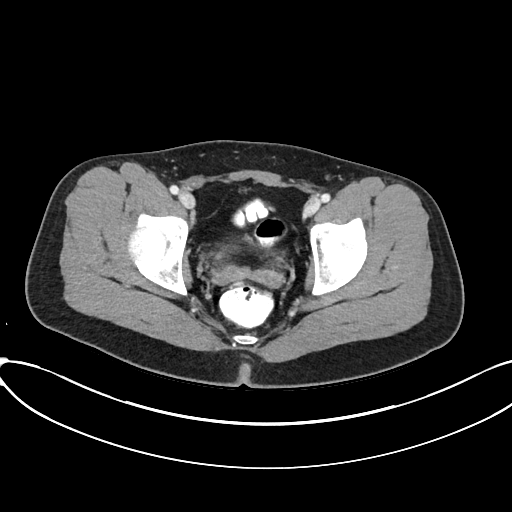
[im 28/103  soft-tissue]
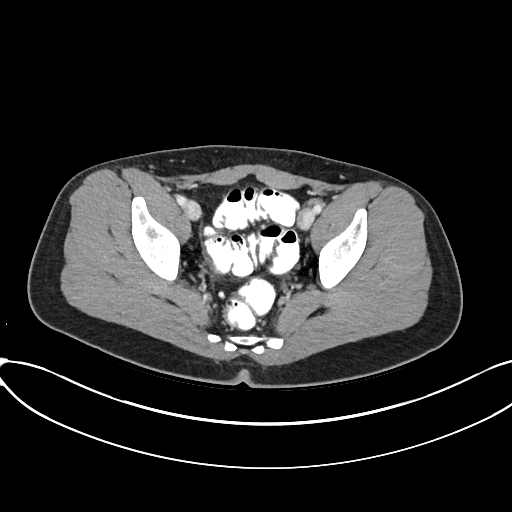
[im 38/103  soft-tissue]
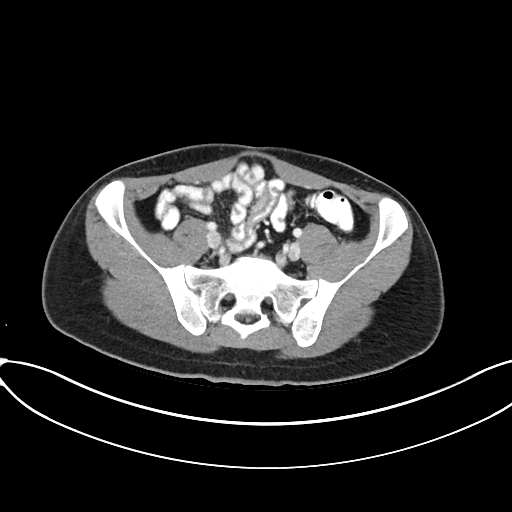
[im 42/103  soft-tissue]
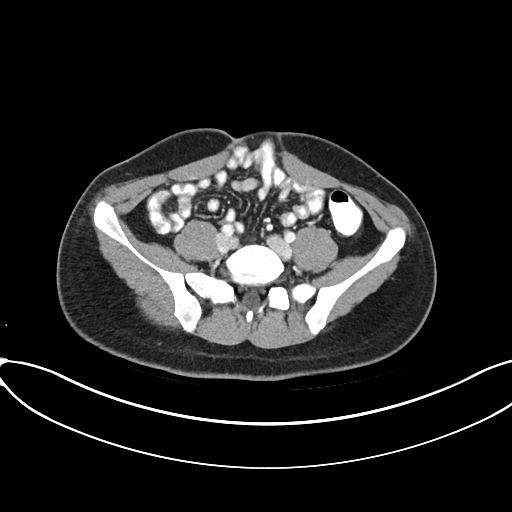
[im 52/103  soft-tissue]
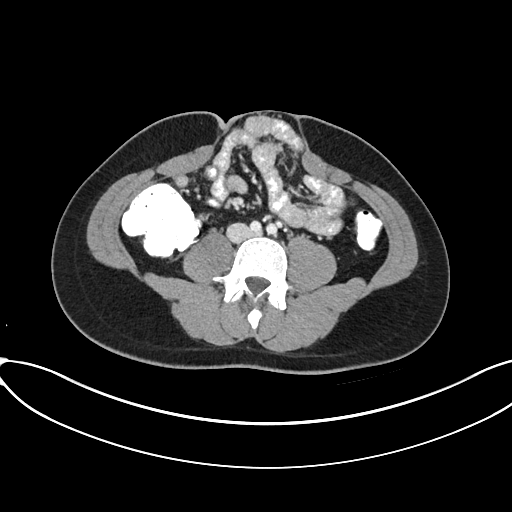
[im 61/103  soft-tissue]
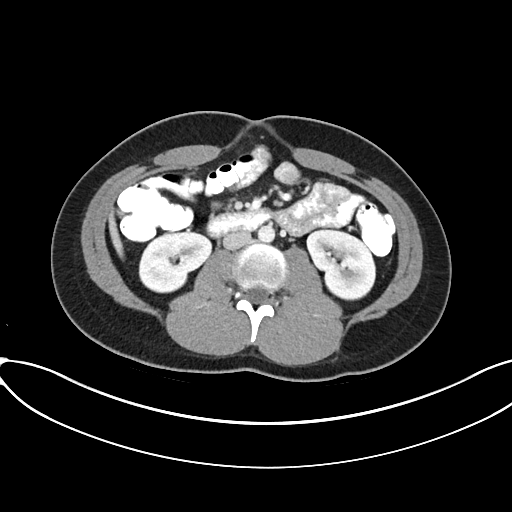
[im 65/103  soft-tissue]
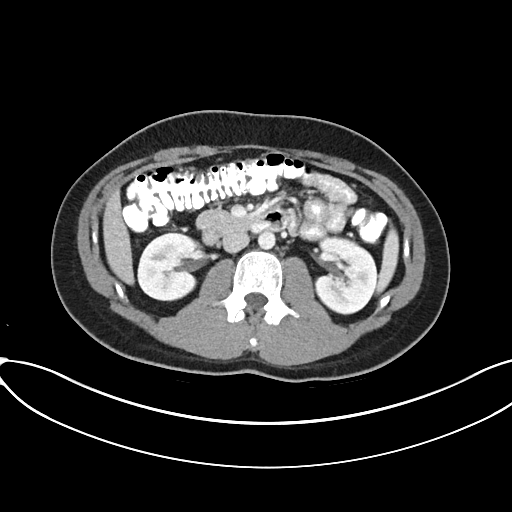
[im 65/103  bone]
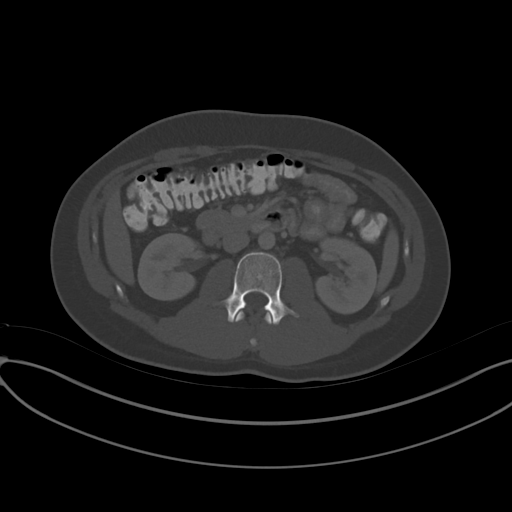
[im 75/103  soft-tissue]
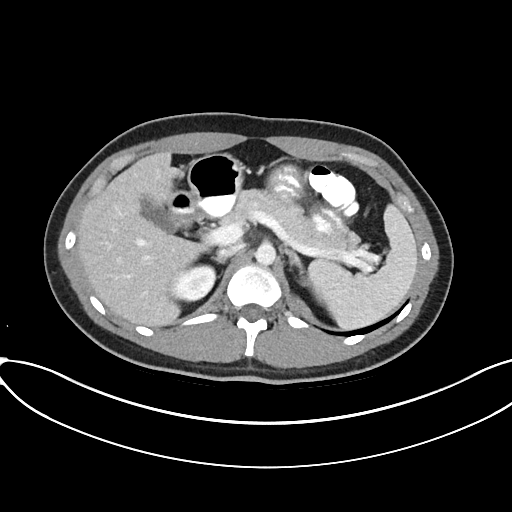
[im 79/103  soft-tissue]
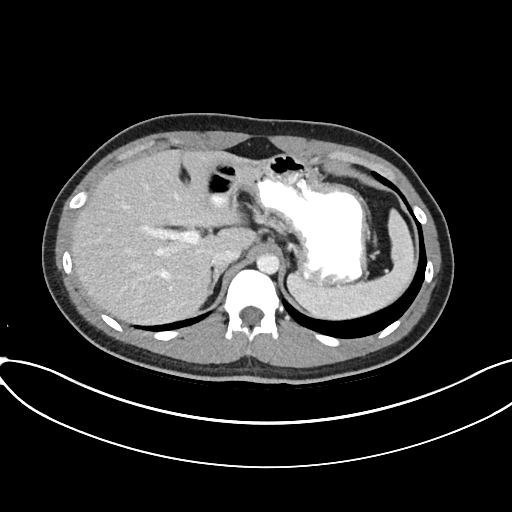
[im 89/103  soft-tissue]
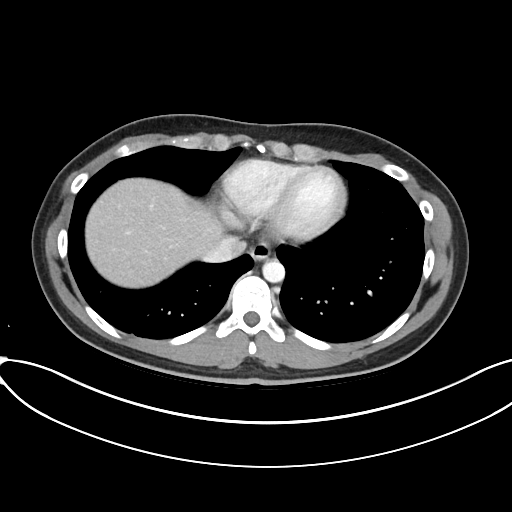
[im 98/103  soft-tissue]
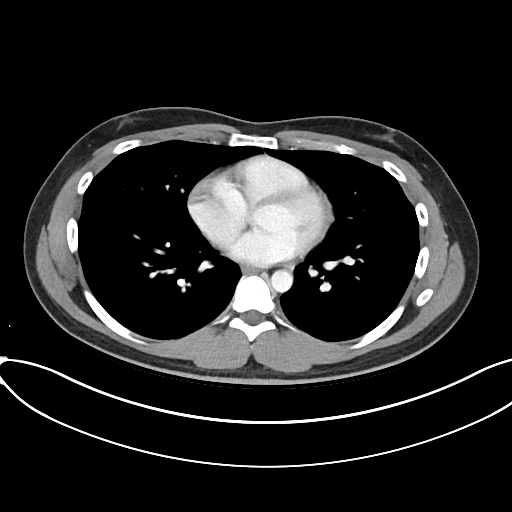

[Series 4: coronals abd pelvis 2.00 cor · coronal · 0.74mm/px · 3 of 115 slices shown]
[im 39/115  soft-tissue]
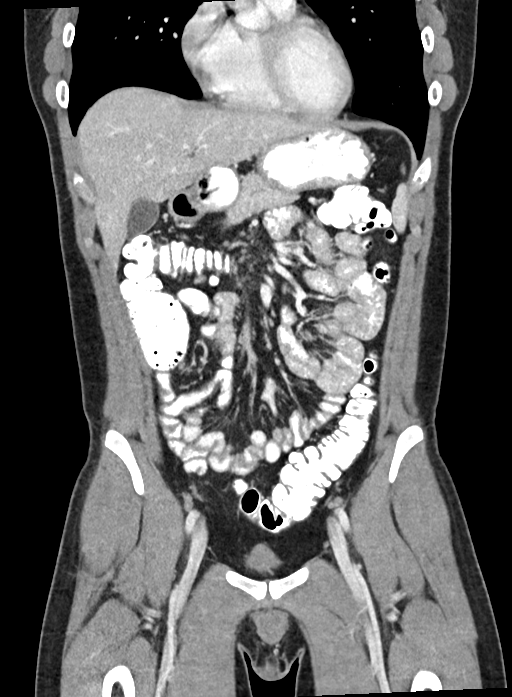
[im 51/115  soft-tissue]
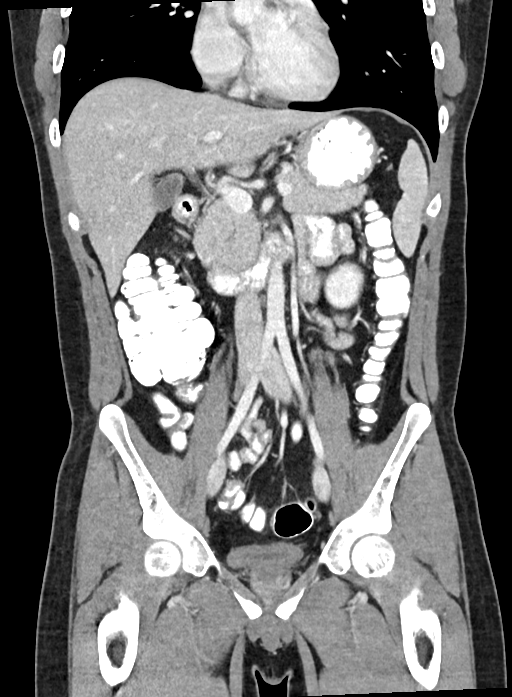
[im 64/115  soft-tissue]
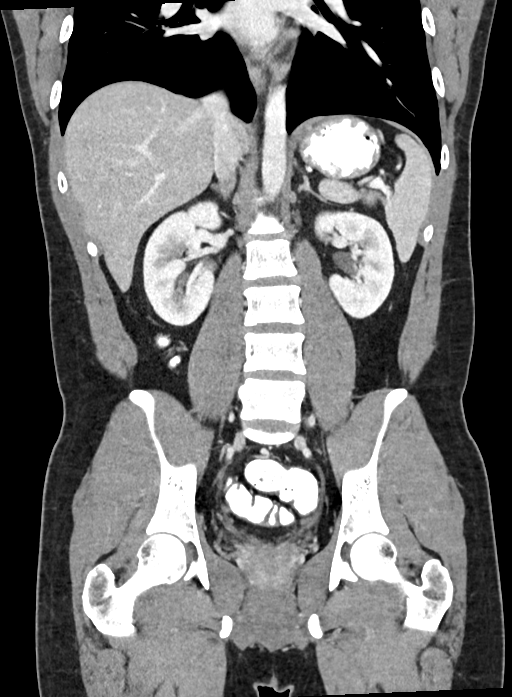

[16 of 46 positions shown; findings below may reference images not displayed]

RADIATION DOSE REDUCTION: This exam was performed according to the
departmental dose-optimization program which includes automated
exposure control, adjustment of the mA and/or kV according to
patient size and/or use of iterative reconstruction technique.

CONTRAST:  100mL OMNIPAQUE IOHEXOL 300 MG/ML  SOLN
FINDINGS: Lower chest: No acute abnormality.

Hepatobiliary: No focal liver abnormality is seen. No gallstones,
gallbladder wall thickening, or biliary dilatation.

Pancreas: Unremarkable. No pancreatic ductal dilatation or
surrounding inflammatory changes.

Spleen: Normal in size without focal abnormality.

Adrenals/Urinary Tract: Adrenal glands appear normal. Kidneys appear
normal. Urinary bladder is nondistended and not well evaluated.

Stomach/Bowel: No bowel obstruction, free air or pneumatosis. No
bowel wall edema identified. Appendix not visualized.

Vascular/Lymphatic: No significant vascular findings are present. No
enlarged abdominal or pelvic lymph nodes.

Reproductive: Prostate is unremarkable.

Other: No ascites. Midline rectus diastasis which contains fat and
multiple loops of small bowel, measuring approximately 7 x 11.7 cm
in transverse and craniocaudal dimensions.

Musculoskeletal: No suspicious bony lesions identified.
IMPRESSION: 1. Rectus diastasis containing protruding fat and multiple loops of
small bowel. No evidence of bowel obstruction.
2. No acute process identified.

## 2022-07-30 IMAGING — CT CT ABD-PELV W/ CM
2 of 4 series · 15 of 46 positions shown, 17 images · IV contrast (APPLIED)
Comparison: CT the abdomen and pelvis 10/06/2021.

CLINICAL DATA: 21-year-old male with history of acute onset of
abdominal pain. Evaluate for entrapped hernia.

EXAM:
CT ABDOMEN AND PELVIS WITH CONTRAST
TECHNIQUE: Multidetector CT imaging of the abdomen and pelvis was performed
using the standard protocol following bolus administration of
intravenous contrast.

[Series 3: abdomen 5.0 · axial · 0.68mm/px · z∈[-906,-510]mm · 12 of 91 slices shown, 14 images]
[im 6/91  soft-tissue]
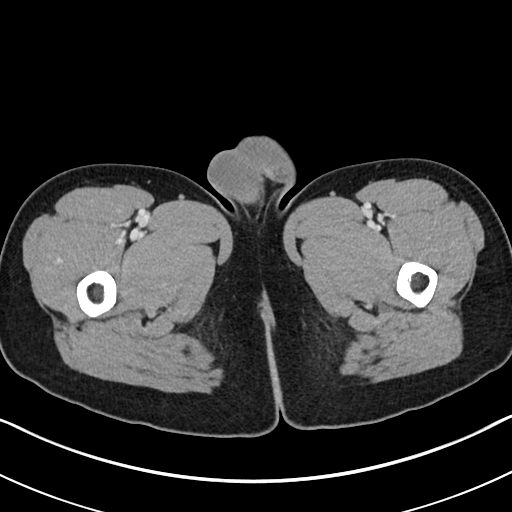
[im 6/91  bone]
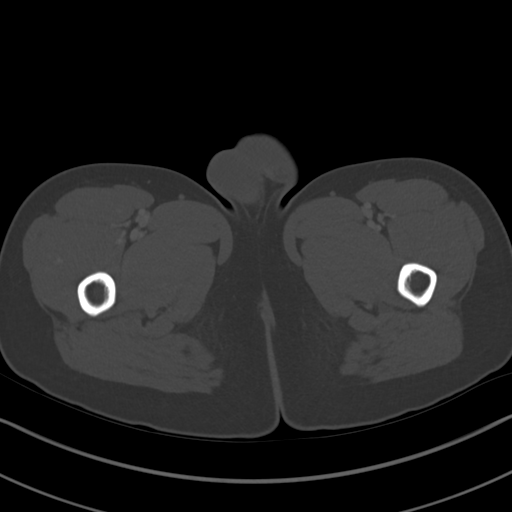
[im 16/91  soft-tissue]
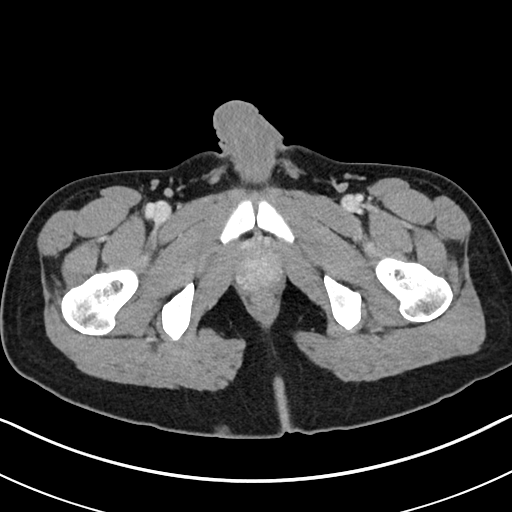
[im 22/91  soft-tissue]
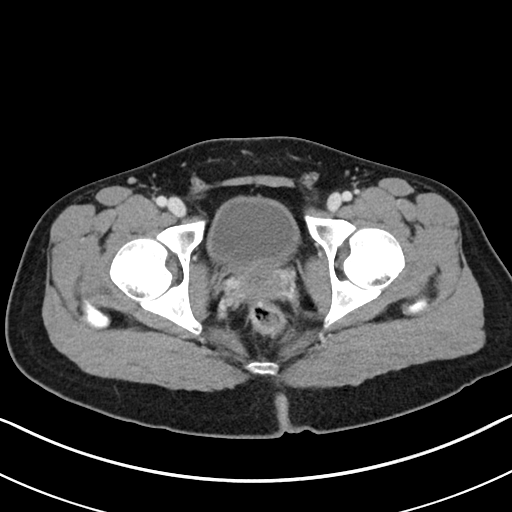
[im 27/91  soft-tissue]
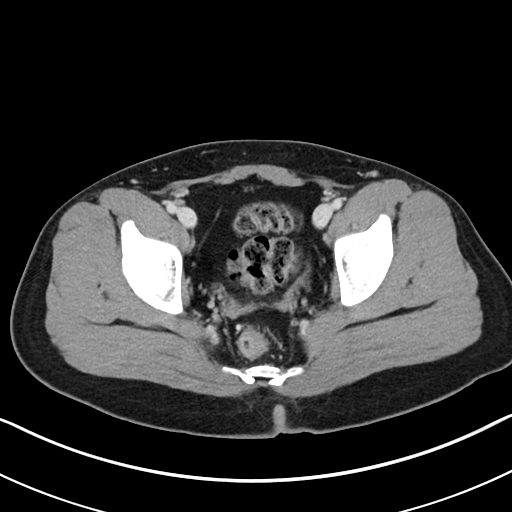
[im 38/91  soft-tissue]
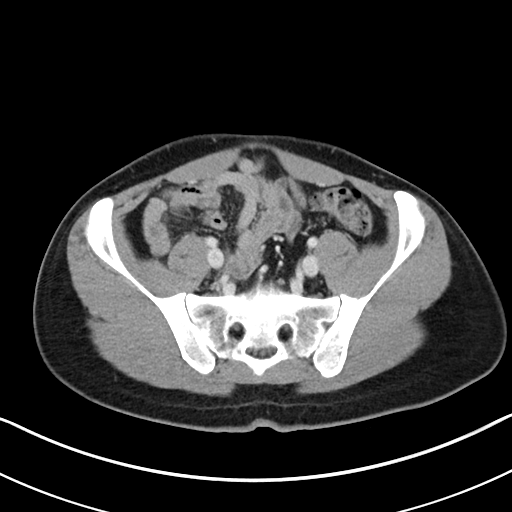
[im 43/91  soft-tissue]
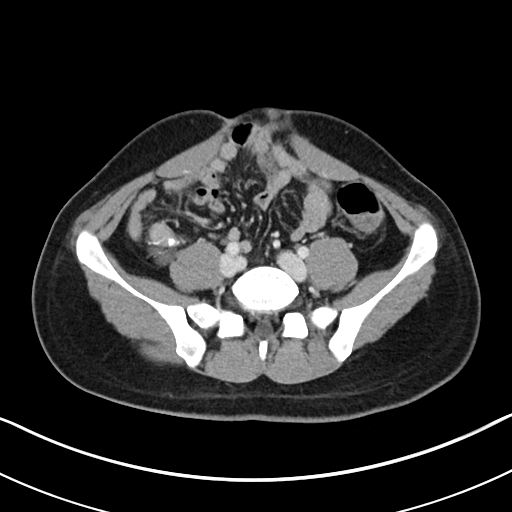
[im 48/91  soft-tissue]
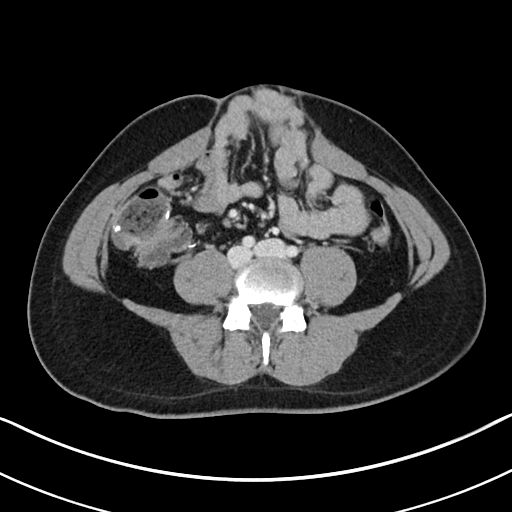
[im 59/91  soft-tissue]
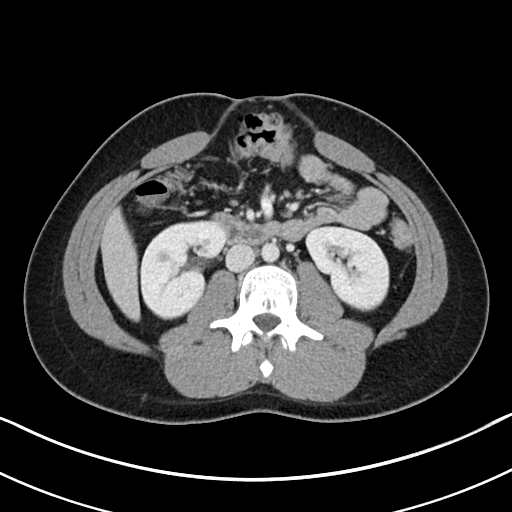
[im 64/91  soft-tissue]
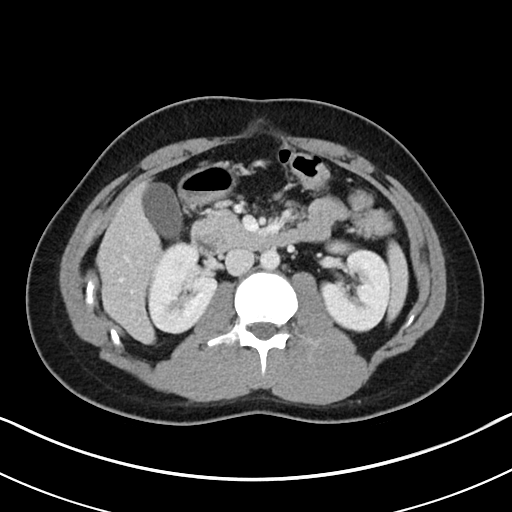
[im 64/91  bone]
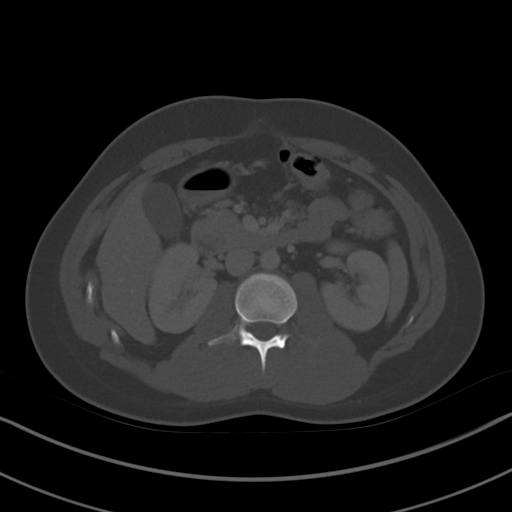
[im 69/91  soft-tissue]
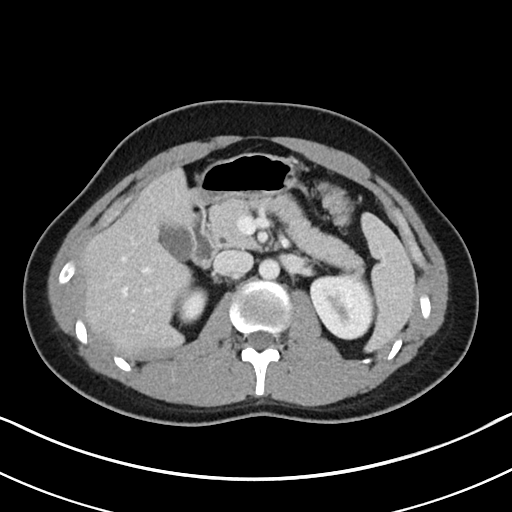
[im 80/91  soft-tissue]
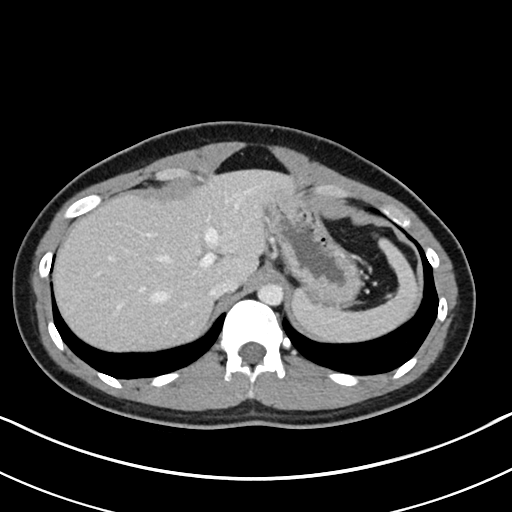
[im 85/91  soft-tissue]
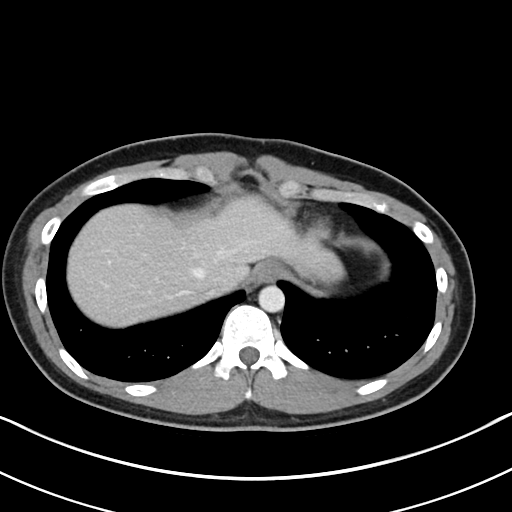

[Series 6: abdomen 3.0 mpr cor · coronal · 0.68mm/px · 3 of 78 slices shown]
[im 26/78  soft-tissue]
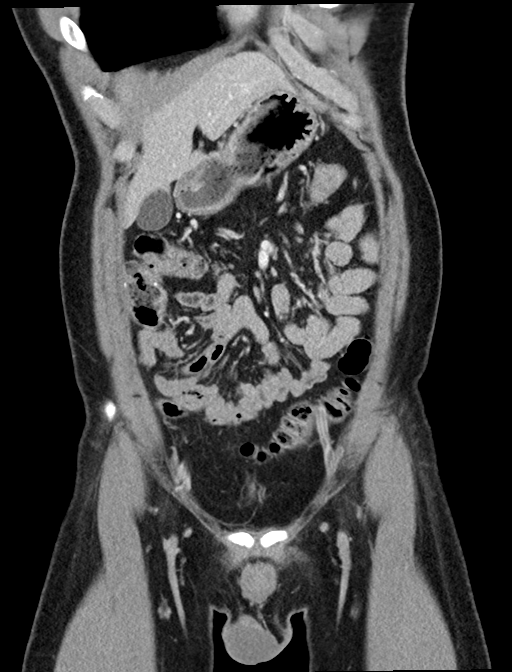
[im 35/78  soft-tissue]
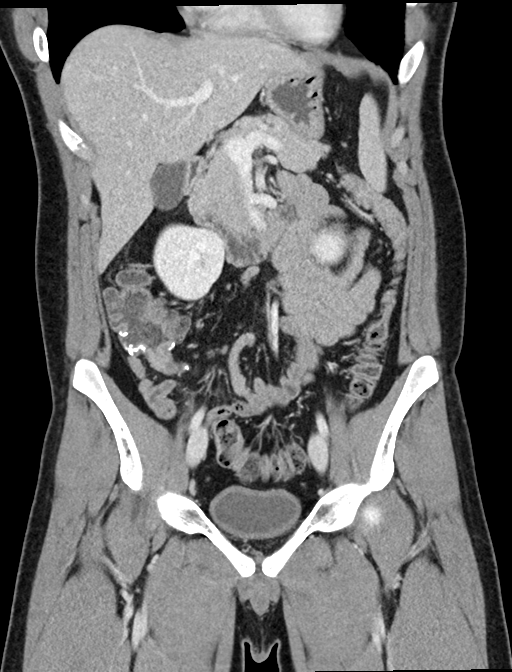
[im 43/78  soft-tissue]
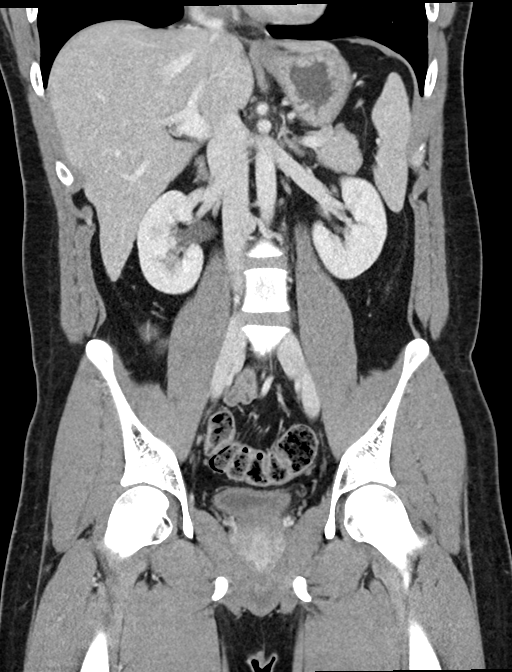

[15 of 46 positions shown; findings below may reference images not displayed]

RADIATION DOSE REDUCTION: This exam was performed according to the
departmental dose-optimization program which includes automated
exposure control, adjustment of the mA and/or kV according to
patient size and/or use of iterative reconstruction technique.

CONTRAST:  80mL OMNIPAQUE IOHEXOL 300 MG/ML  SOLN
FINDINGS: Lower chest: Unremarkable.

Hepatobiliary: No suspicious cystic or solid hepatic lesions. No
intra or extrahepatic biliary ductal dilatation. Gallbladder is
normal in appearance.

Pancreas: No pancreatic mass. No pancreatic ductal dilatation. No
pancreatic or peripancreatic fluid collections or inflammatory
changes.

Spleen: Unremarkable.

Adrenals/Urinary Tract: Bilateral kidneys and adrenal glands are
normal in appearance. No hydroureteronephrosis. Urinary bladder is
normal in appearance.

Stomach/Bowel: The appearance of the stomach is normal. There is no
pathologic dilatation of small bowel or colon. Postoperative changes
of partial bowel resection are noted in the region of the distal
ileum and cecum. Appendix is surgically absent. Multiple loops of
small bowel and a short segment of the mid transverse colon extends
into a wide-necked ventral hernia.

Vascular/Lymphatic: No significant atherosclerotic disease, aneurysm
or dissection noted in the abdominal or pelvic vasculature. No
lymphadenopathy noted in the abdomen or pelvis.

Reproductive: Prostate gland and seminal vesicles are unremarkable
in appearance.

Other: No significant volume of ascites.  No pneumoperitoneum.

Musculoskeletal: There are no aggressive appearing lytic or blastic
lesions noted in the visualized portions of the skeleton.
IMPRESSION: 1. Wide-necked ventral hernia redemonstrated containing multiple
loops of small bowel and a short portion of the mid transverse
colon, without evidence of bowel incarceration or obstruction at
this time.
2. No acute findings are noted in the abdomen or pelvis to account
for the patient's symptoms.
3. Additional incidental findings, as above.
